# Patient Record
Sex: Male | Born: 1965 | Race: White | Hispanic: No | Marital: Married | Smoking: Never smoker
Health system: Southern US, Community
[De-identification: ages and names within clinical notes are randomized; demographics above are authoritative.]

## PROBLEM LIST (undated history)

## (undated) DIAGNOSIS — E785 Hyperlipidemia, unspecified: Secondary | ICD-10-CM

## (undated) DIAGNOSIS — E079 Disorder of thyroid, unspecified: Secondary | ICD-10-CM

## (undated) DIAGNOSIS — I10 Essential (primary) hypertension: Secondary | ICD-10-CM

## (undated) HISTORY — DX: Essential (primary) hypertension: I10

## (undated) HISTORY — DX: Hyperlipidemia, unspecified: E78.5

## (undated) HISTORY — PX: THYROIDECTOMY: SHX17

## (undated) HISTORY — DX: Disorder of thyroid, unspecified: E07.9

---

## 2002-02-27 ENCOUNTER — Encounter: Payer: Self-pay | Admitting: Family Medicine

## 2002-02-27 ENCOUNTER — Encounter: Admission: RE | Admit: 2002-02-27 | Discharge: 2002-02-27 | Payer: Self-pay | Admitting: Family Medicine

## 2011-02-15 ENCOUNTER — Other Ambulatory Visit: Payer: Self-pay | Admitting: Orthopedic Surgery

## 2011-02-15 DIAGNOSIS — M25561 Pain in right knee: Secondary | ICD-10-CM

## 2011-02-17 ENCOUNTER — Ambulatory Visit
Admission: RE | Admit: 2011-02-17 | Discharge: 2011-02-17 | Disposition: A | Discharge status: Home / Self Care | Payer: BLUE CROSS/BLUE SHIELD | Source: Ambulatory Visit | Attending: Orthopedic Surgery | Admitting: Orthopedic Surgery

## 2011-02-17 DIAGNOSIS — M25561 Pain in right knee: Secondary | ICD-10-CM

## 2011-02-17 MED ORDER — GADOBENATE DIMEGLUMINE 529 MG/ML IV SOLN
19.0000 mL | Freq: Once | INTRAVENOUS | Status: AC | PRN
  Administered 2011-02-17: 19 mL via INTRAVENOUS

## 2011-12-18 DIAGNOSIS — C499 Malignant neoplasm of connective and soft tissue, unspecified: Secondary | ICD-10-CM | POA: Insufficient documentation

## 2012-06-18 ENCOUNTER — Ambulatory Visit
Admission: RE | Admit: 2012-06-18 | Discharge: 2012-06-18 | Disposition: A | Discharge status: Home / Self Care | Payer: BLUE CROSS/BLUE SHIELD | Source: Ambulatory Visit | Attending: Orthopedic Surgery | Admitting: Orthopedic Surgery

## 2012-06-18 ENCOUNTER — Other Ambulatory Visit: Payer: Self-pay | Admitting: Orthopedic Surgery

## 2012-06-18 DIAGNOSIS — E041 Nontoxic single thyroid nodule: Secondary | ICD-10-CM

## 2012-06-19 ENCOUNTER — Other Ambulatory Visit: Payer: Self-pay | Admitting: Orthopedic Surgery

## 2012-06-19 DIAGNOSIS — E041 Nontoxic single thyroid nodule: Secondary | ICD-10-CM

## 2012-06-26 ENCOUNTER — Ambulatory Visit
Admission: RE | Admit: 2012-06-26 | Discharge: 2012-06-26 | Disposition: A | Discharge status: Home / Self Care | Payer: BC Managed Care – PPO | Source: Ambulatory Visit | Attending: Orthopedic Surgery | Admitting: Orthopedic Surgery

## 2012-06-26 ENCOUNTER — Other Ambulatory Visit (HOSPITAL_COMMUNITY)
Admission: RE | Admit: 2012-06-26 | Discharge: 2012-06-26 | Disposition: A | Discharge status: Home / Self Care | Payer: BC Managed Care – PPO | Source: Ambulatory Visit | Attending: Diagnostic Radiology | Admitting: Diagnostic Radiology

## 2012-06-26 DIAGNOSIS — E041 Nontoxic single thyroid nodule: Secondary | ICD-10-CM

## 2012-06-26 DIAGNOSIS — E049 Nontoxic goiter, unspecified: Secondary | ICD-10-CM | POA: Insufficient documentation

## 2012-07-10 NOTE — Progress Notes (Signed)
Quick Note:  DR Jana Half SHOULD RECEIVE THIS IN THE MAIL, BUT I ALSO FAXED IT TO [phone]. ______

## 2012-07-19 DIAGNOSIS — I10 Essential (primary) hypertension: Secondary | ICD-10-CM | POA: Insufficient documentation

## 2012-07-19 DIAGNOSIS — K219 Gastro-esophageal reflux disease without esophagitis: Secondary | ICD-10-CM | POA: Insufficient documentation

## 2012-08-29 DIAGNOSIS — C73 Malignant neoplasm of thyroid gland: Secondary | ICD-10-CM | POA: Insufficient documentation

## 2012-12-16 DIAGNOSIS — R911 Solitary pulmonary nodule: Secondary | ICD-10-CM | POA: Insufficient documentation

## 2013-08-13 IMAGING — US US THYROID BIOPSY
1 series · 5 of 5 positions shown · non-contrast
Comparison: 06/18/2012

CLINICAL DATA: Dominant thyroid nodule.

ULTRASOUND GUIDED NEEDLE ASPIRATE BIOPSY OF THE THYROID GLAND

[Series 1: us thyroid biopsy · 5 acquisitions, 5 frames shown]
[im 1/5]
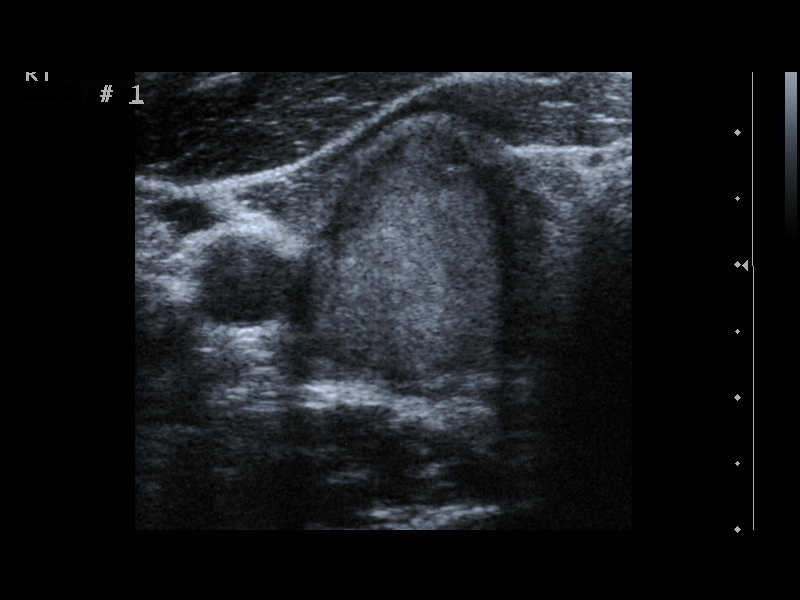
[im 2/5]
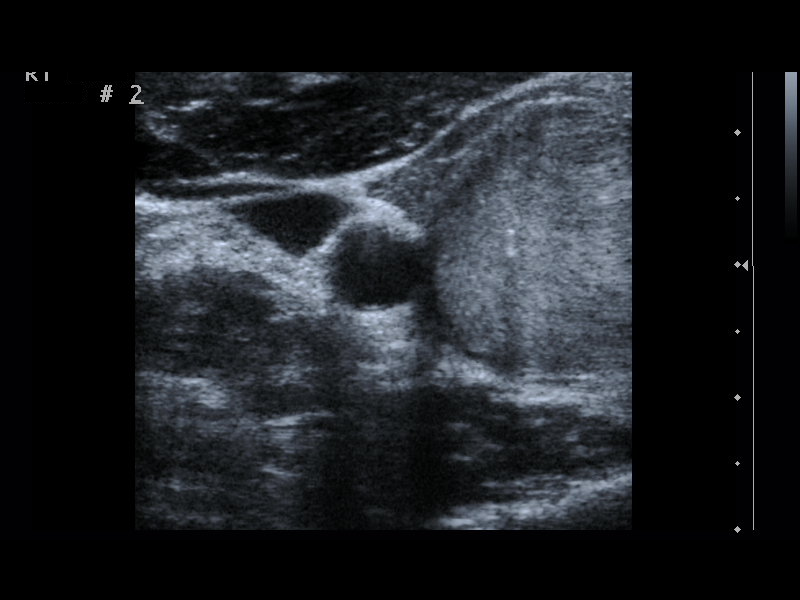
[im 3/5]
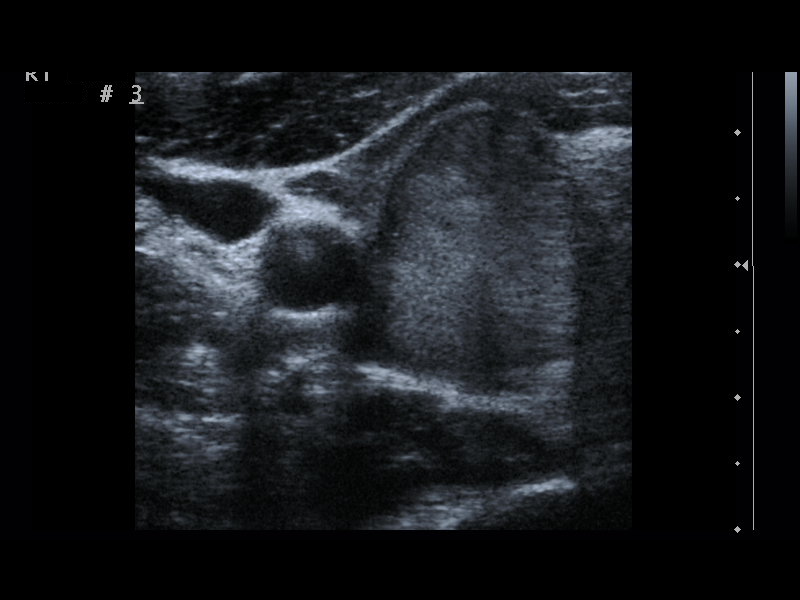
[im 4/5]
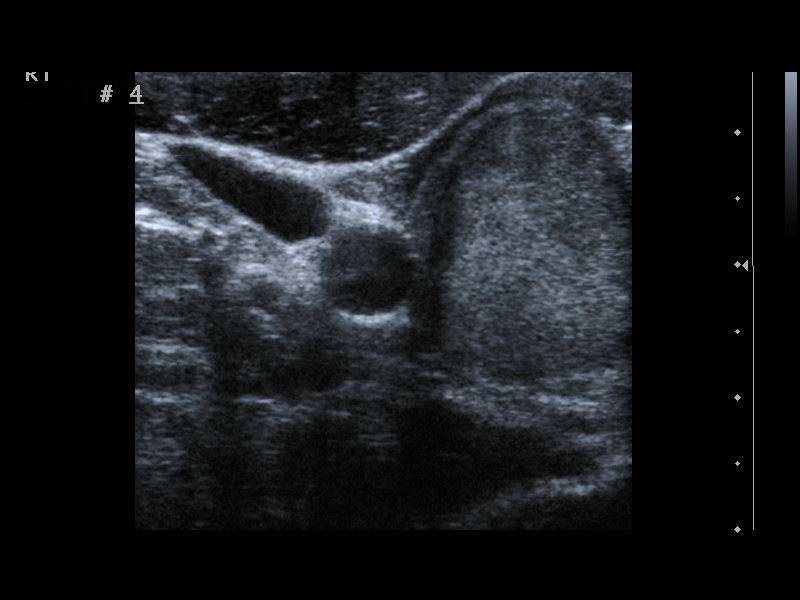
[im 5/5]
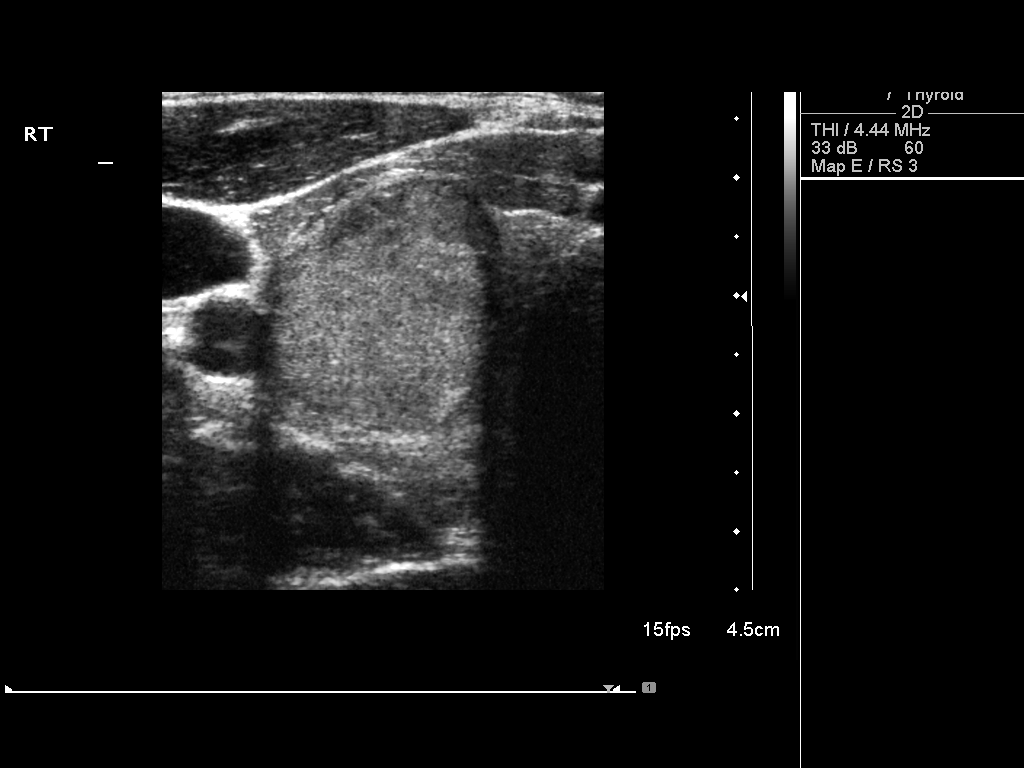

[5 of 5 positions shown; findings below may reference images not displayed]

Thyroid biopsy was thoroughly discussed with the patient and
questions were answered.  The benefits, risks, alternatives, and
complications were also discussed.  The patient understands and
wishes to proceed with the procedure.  Written consent was
obtained.

Ultrasound was performed to localize and mark an adequate site for
the biopsy.  The patient was then prepped and draped in a normal
sterile fashion.  Local anesthesia was provided with 1% lidocaine.
Using direct ultrasound guidance, 4 passes were made using 25 gauge
needles into the nodule within the right lobe of the thyroid.
Ultrasound was used to confirm needle placements on all occasions.
Specimens were sent to Pathology for analysis.

Complications:  None
FINDINGS: Dominant nodule in the right thyroid lobe.  Needle
position was confirmed within the lesion.
IMPRESSION: Ultrasound guided needle aspirate biopsy performed of the right
thyroid nodule.

## 2014-06-16 DIAGNOSIS — D490 Neoplasm of unspecified behavior of digestive system: Secondary | ICD-10-CM | POA: Insufficient documentation

## 2015-02-25 ENCOUNTER — Ambulatory Visit (INDEPENDENT_AMBULATORY_CARE_PROVIDER_SITE_OTHER): Payer: BLUE CROSS/BLUE SHIELD | Admitting: Podiatry

## 2015-02-25 ENCOUNTER — Ambulatory Visit (INDEPENDENT_AMBULATORY_CARE_PROVIDER_SITE_OTHER): Payer: BLUE CROSS/BLUE SHIELD

## 2015-02-25 ENCOUNTER — Encounter: Payer: Self-pay | Admitting: Podiatry

## 2015-02-25 VITALS — BP 128/84 | HR 77 | Resp 11 | Ht 70.0 in | Wt 210.0 lb

## 2015-02-25 DIAGNOSIS — M79671 Pain in right foot: Secondary | ICD-10-CM | POA: Diagnosis not present

## 2015-02-25 DIAGNOSIS — M722 Plantar fascial fibromatosis: Secondary | ICD-10-CM | POA: Diagnosis not present

## 2015-02-25 MED ORDER — METHYLPREDNISOLONE 4 MG PO TBPK: ORAL_TABLET | ORAL | Status: DC

## 2015-02-25 MED ORDER — MELOXICAM 15 MG PO TABS: 15.0000 mg | ORAL_TABLET | Freq: Every day | ORAL | Status: DC

## 2015-02-25 NOTE — Progress Notes (Signed)
   Subjective:    Patient ID: Drew Fisher, male    DOB: 29-May-1966, 49 y.o.   MRN: [id number]  HPI Comments: Pt states just about a year ago he was seen by an orthopedist and received a cortisone shot for the right heel pain, and had no relief.  Pt states he was also given stretches which worsened the pain, but athletic shoes and rest help.     Review of Systems  Endocrine:       Hx of thyroid cancer treated with surgery.  All other systems reviewed and are negative.      Objective:   Physical Exam: I have reviewed his past medical history medications allergies surgery social history and review of systems. Pulses are strongly palpable. Neurologic sensorium is intact per Semmes-Weinstein monofilament. Deep tendon reflexes are intact bilaterally muscle strength is 5 over 5 dorsiflexion plantar flexors and inverters and everters all intrinsic musculature is intact. Orthopedic evaluation of his resolved joints distal to the ankle in full range of motion without crepitation. Cutaneous evaluation demonstrates supple well-hydrated cutis no erythema edema saline drainage or odor. Radiograph does demonstrate soft tissue increased density at the plantar fascial calcaneal insertion site of the right heel with plantar distally oriented calcaneal heel spur. No fractures are identified. Pain on palpation medial calcaneal tubercle of the right heel consistent with plantar fasciitis.        Assessment & Plan:  Assessment: Plantar fasciitis right foot.  Plan: Discussed etiology pathology conservative or surgical therapies. Injected his right heel today with Kenalog and local anesthetic. Started him on a Medrol Dosepak to be followed by meloxicam. Placed in the plantar fascial strap and a plantar fascial night splint. Discussed appropriate shoe gear stretching exercises and ice therapy. If he is not improved next visit we will consider orthotics. We may consider surgical intervention. This has been  going on for over a year.

## 2015-02-25 NOTE — Patient Instructions (Signed)

## 2015-04-01 ENCOUNTER — Encounter: Payer: Self-pay | Admitting: Podiatry

## 2015-04-01 ENCOUNTER — Ambulatory Visit (INDEPENDENT_AMBULATORY_CARE_PROVIDER_SITE_OTHER): Payer: BLUE CROSS/BLUE SHIELD | Admitting: Podiatry

## 2015-04-01 VITALS — BP 140/99 | HR 81 | Resp 16

## 2015-04-01 DIAGNOSIS — M722 Plantar fascial fibromatosis: Secondary | ICD-10-CM

## 2015-04-01 NOTE — Progress Notes (Signed)
Presents today for follow-up of plantar fasciitis and to be scheduled for orthotics.  Objective: Vital signs are stable alert and oriented 3. No pain on palpation medial calcaneal tubercle.  Assessment: Well-healed plantar fasciitis.  Plan: Continue all conservative therapies. Orthotic scan today. Follow-up with me once estimated. Continue all other conservative therapies.

## 2015-04-13 ENCOUNTER — Other Ambulatory Visit: Payer: Self-pay | Admitting: Family Medicine

## 2015-04-13 DIAGNOSIS — K769 Liver disease, unspecified: Secondary | ICD-10-CM

## 2015-04-15 ENCOUNTER — Ambulatory Visit
Admission: RE | Admit: 2015-04-15 | Discharge: 2015-04-15 | Disposition: A | Discharge status: Home / Self Care | Payer: BLUE CROSS/BLUE SHIELD | Source: Ambulatory Visit | Attending: Family Medicine | Admitting: Family Medicine

## 2015-04-15 DIAGNOSIS — K769 Liver disease, unspecified: Secondary | ICD-10-CM

## 2015-04-15 MED ORDER — GADOBENATE DIMEGLUMINE 529 MG/ML IV SOLN
20.0000 mL | Freq: Once | INTRAVENOUS | Status: AC | PRN
  Administered 2015-04-15: 20 mL via INTRAVENOUS

## 2015-04-26 ENCOUNTER — Inpatient Hospital Stay
Admission: RE | Admit: 2015-04-26 | Discharge: 2015-04-26 | Disposition: A | Discharge status: Home / Self Care | Payer: Self-pay | Source: Ambulatory Visit | Attending: Family Medicine | Admitting: Family Medicine

## 2015-04-26 ENCOUNTER — Other Ambulatory Visit: Payer: Self-pay | Admitting: Family Medicine

## 2015-04-26 DIAGNOSIS — R52 Pain, unspecified: Secondary | ICD-10-CM

## 2015-07-01 ENCOUNTER — Encounter: Payer: Self-pay | Admitting: Podiatry

## 2015-07-01 ENCOUNTER — Ambulatory Visit (INDEPENDENT_AMBULATORY_CARE_PROVIDER_SITE_OTHER): Payer: BLUE CROSS/BLUE SHIELD | Admitting: Podiatry

## 2015-07-01 ENCOUNTER — Ambulatory Visit (INDEPENDENT_AMBULATORY_CARE_PROVIDER_SITE_OTHER): Payer: BLUE CROSS/BLUE SHIELD

## 2015-07-01 VITALS — BP 142/84 | HR 69 | Resp 16

## 2015-07-01 DIAGNOSIS — M722 Plantar fascial fibromatosis: Secondary | ICD-10-CM

## 2015-07-01 MED ORDER — MELOXICAM 15 MG PO TABS: 15.0000 mg | ORAL_TABLET | Freq: Every day | ORAL | Status: DC

## 2015-07-02 NOTE — Progress Notes (Signed)
He presents today for follow-up of his plantar fasciitis. He states that the left is doing great now the right one is starting to hurt. He states that he also needs more of his meloxicam. He would like to go ahead and see about getting the orthotics at all possible.  Objective: vital signs are stable alert and oriented 3. Pulses are strongly palpable bilateral. Neurologic sensorium is intact per Semmes-Weinstein monofilament. Deep tendon reflexes are intact bilateral and muscle strength is 5 over 5 dorsiflexors plantar flexors and inverters everters all intrinsic musculature is intact. Orthopedic evaluation demonstrates all joints distal to the ankle for range of motion without crepitus no pain on palpation medial calcaneal tubercle of the left heel however he does have pain on palpation medial calcaneal tubercle of the right heel. Radiographs taken today do demonstrate 3 views of the right foot with soft tissue increase in density at the plantar fascial calcaneal insertion site. This is indicative of plantar fasciitis. Cutaneous evaluation is relatively normal.   Assessment: resolving plantar fasciitis left plantar fasciitis right acute.  Plan: discussed etiology pathology conservative versus surgical therapies. Injected the right heel today with Kenalog and local and aesthetic. He was scanned for set of orthotics. Rotatory prescription for meloxicam 15 mg 1 by mouth daily. Follow up with him once his orthotics come in.

## 2015-07-27 ENCOUNTER — Ambulatory Visit: Payer: BLUE CROSS/BLUE SHIELD | Admitting: *Deleted

## 2015-07-27 DIAGNOSIS — M722 Plantar fascial fibromatosis: Secondary | ICD-10-CM

## 2015-07-27 NOTE — Progress Notes (Signed)
Patient ID: Drew Fisher, male   DOB: 17-Jul-1966, 49 y.o.   MRN: [id number] Patient presents for orthotic pick up.  Verbal and written break in and wear instructions given.  Patient will follow up in 4 weeks if symptoms worsen or fail to improve.

## 2015-07-27 NOTE — Patient Instructions (Signed)

## 2015-11-04 ENCOUNTER — Ambulatory Visit: Payer: BLUE CROSS/BLUE SHIELD | Admitting: Podiatry

## 2015-11-05 ENCOUNTER — Ambulatory Visit (INDEPENDENT_AMBULATORY_CARE_PROVIDER_SITE_OTHER): Payer: BLUE CROSS/BLUE SHIELD | Admitting: Podiatry

## 2015-11-05 ENCOUNTER — Ambulatory Visit (INDEPENDENT_AMBULATORY_CARE_PROVIDER_SITE_OTHER): Payer: BLUE CROSS/BLUE SHIELD

## 2015-11-05 ENCOUNTER — Encounter: Payer: Self-pay | Admitting: Podiatry

## 2015-11-05 VITALS — BP 124/82 | HR 86 | Resp 18

## 2015-11-05 DIAGNOSIS — R52 Pain, unspecified: Secondary | ICD-10-CM | POA: Diagnosis not present

## 2015-11-05 DIAGNOSIS — R200 Anesthesia of skin: Secondary | ICD-10-CM

## 2015-11-05 DIAGNOSIS — R208 Other disturbances of skin sensation: Secondary | ICD-10-CM

## 2015-11-05 NOTE — Patient Instructions (Signed)

## 2015-11-08 ENCOUNTER — Encounter: Payer: Self-pay | Admitting: Podiatry

## 2015-11-09 ENCOUNTER — Encounter: Payer: Self-pay | Admitting: Podiatry

## 2015-11-09 NOTE — Progress Notes (Signed)
Patient ID: Drew Fisher, male   DOB: [birth year], 50 y.o.   MRN: [id number]  Subjective: 50 year old male presents the office with concerns of left fourth and fifth toe numbness which is been ongoing for last several weeks. He states this started after he worked old shoes while doing housework. He states the heel pain is much improved. He denies any recent injury or trauma. Denies any swelling or redness. No other complaints at this time in no acute changes.  Objective: AAO 3, NAD DP/PT pulses 2/4, CRT less than 3 seconds Protective sensation intact with Simms Weinstein monofilament. There is decreased sensation of the left fourth and fifth toes however overall sensation is intact with Derrel Nip monofilament There is no tenderness to palpation and no pinpoint bony tenderness to bilateral lower extremity and there is no pain along the digits or metatarsals. There is no pain with MPJ range of motion or restrictions. No open lesions or pre-ulcerative lesions. There is no pain with calf compression, swelling, warmth, erythema.  Assessment: 50 year old male left fourth and fifth toe numbness  Plan: -Treatment options discussed including all alternatives, risks, and complications -X-rays were obtained and reviewed with the patient. Compared to prior x-rays there does appear to be some change the distal phalanx of the left fourth toe. No history of injury or trauma.  -Etiology of symptoms were discussed -Discussed shoe gear modifications to help take pressure off the fourth and fifth toes. -Anti-inflammatories as needed. -If symptoms not resolve in the next 3-4 weeks to call the office or sooner if there is any worsening. Call any questions or concerns.  Celesta Gentile, DPM

## 2015-12-02 ENCOUNTER — Ambulatory Visit: Payer: BLUE CROSS/BLUE SHIELD | Admitting: Podiatry

## 2016-06-01 IMAGING — MR MR ABDOMEN WO/W CM
9 of 17 series · 24 of 48 positions shown · IV contrast (multihance)
Comparison: [REDACTED] MRI of 07/01/2014

ADDENDUM:
Although mentioned in the body of report, the abnormal enhancement
identified in the lower right chest wall was not mentioned in the
IMPRESSION section of the report. Given that the finding may be
related to a rib or pleural lesion, further evaluation is
recommended. CT scan of the chest with contrast may prove helpful to
further evaluate.

These results will be called to the ordering clinician or
representative by the Radiologist Assistant, and communication
documented in the PACS or zVision Dashboard.
CLINICAL DATA: Subsequent encounter for lobulated enhancing liver
lesions in a patient with history of thyroid cancer and right knee
sarcoma.
EXAM:
MRI ABDOMEN WITHOUT AND WITH CONTRAST
TECHNIQUE: Multiplanar multisequence MR imaging of the abdomen was performed
both before and after the administration of intravenous contrast.
CONTRAST:  20mL MULTIHANCE GADOBENATE DIMEGLUMINE 529 MG/ML IV SOLN

[Series 2: T2 · axial · 5.0mm · 0.59mm/px · z∈[-131,+103]mm · 2 of 40 slices shown]
[im 1/40]
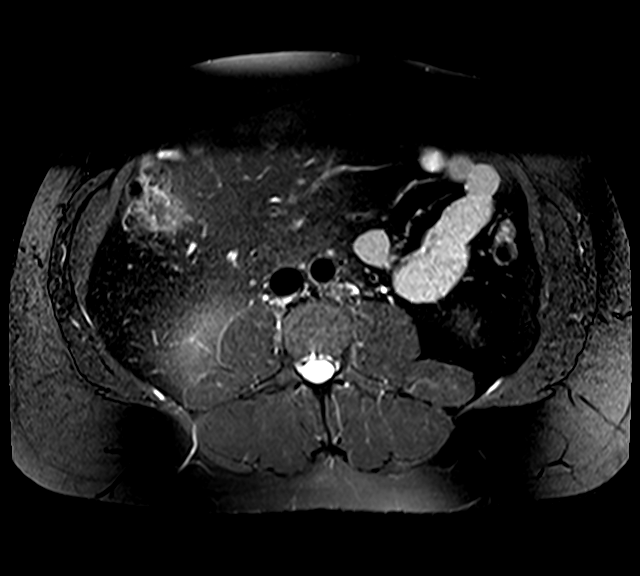
[im 40/40]
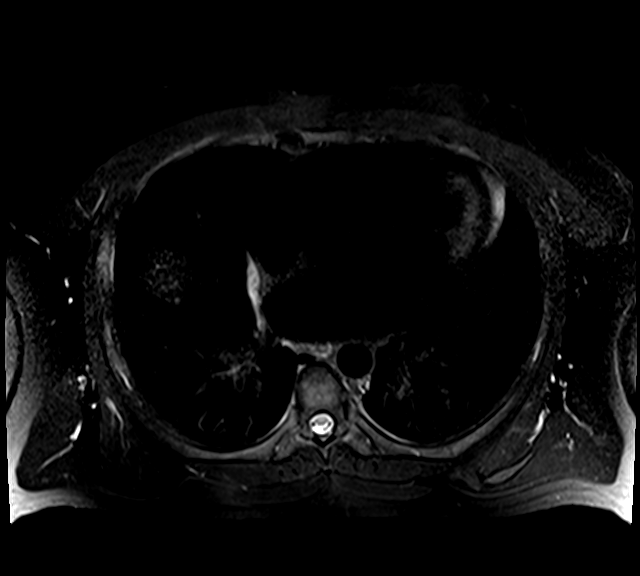

[Series 3: [id number] · axial · 5.0mm · 1.98mm/px · z∈[-131,+103]mm · 5 of 120 slices shown]
[im 1/120]
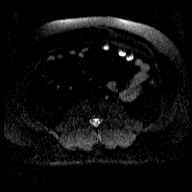
[im 30/120]
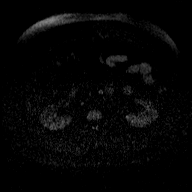
[im 60/120]
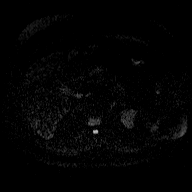
[im 90/120]
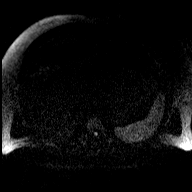
[im 120/120]
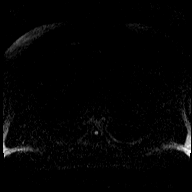

[Series 4: [id number] · axial · 5.0mm · 1.98mm/px · z∈[-131,+103]mm · 2 of 40 slices shown]
[im 1/40]
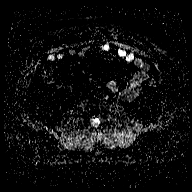
[im 40/40]
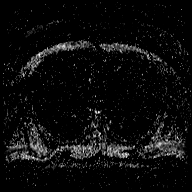

[Series 6: cor haste · coronal · 5.0mm · 0.74mm/px · 2 of 37 slices shown]
[im 1/37]
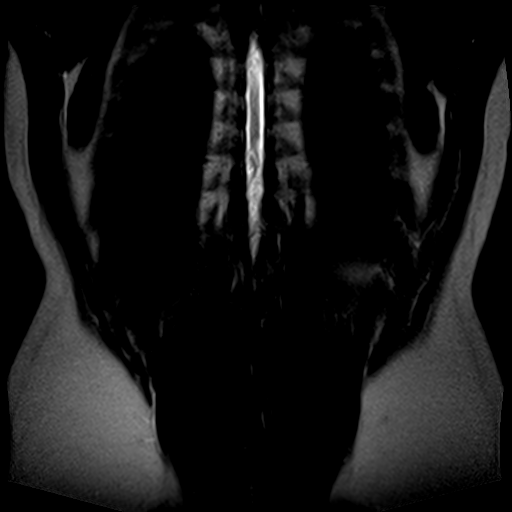
[im 37/37]
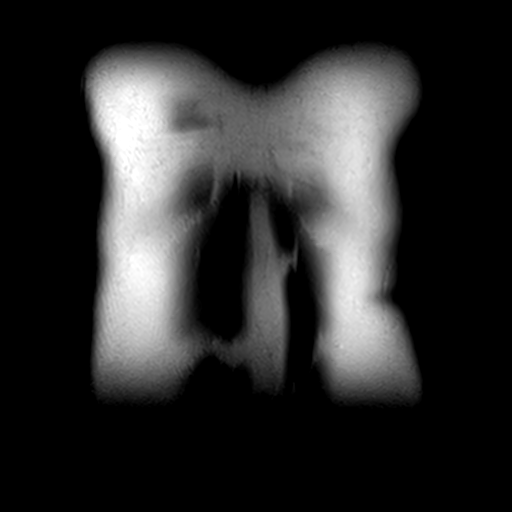

[Series 7: T1 · axial · 5.0mm · 0.74mm/px · z∈[-158,+77]mm · 4 of 96 slices shown]
[im 1/96]
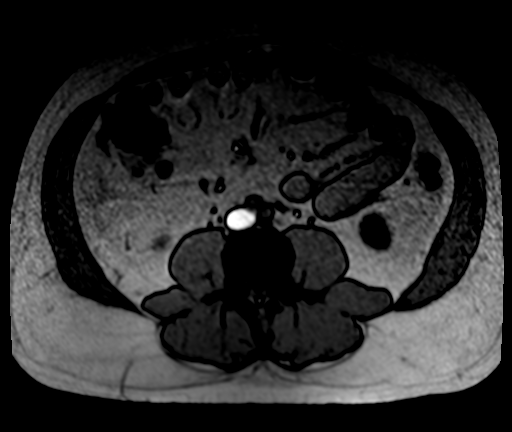
[im 32/96]
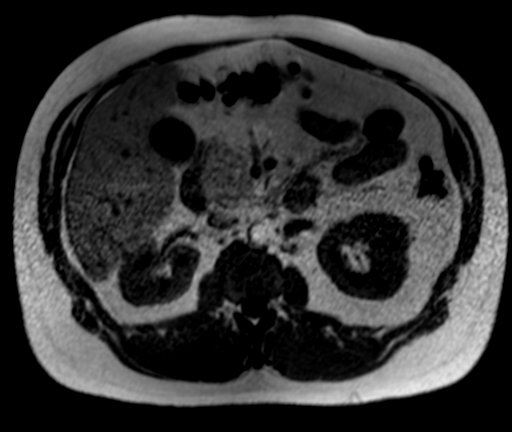
[im 64/96]
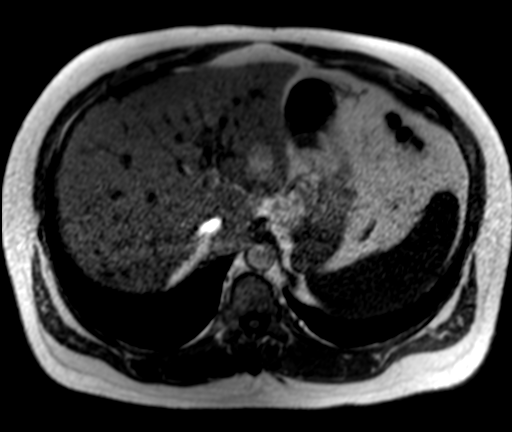
[im 96/96]
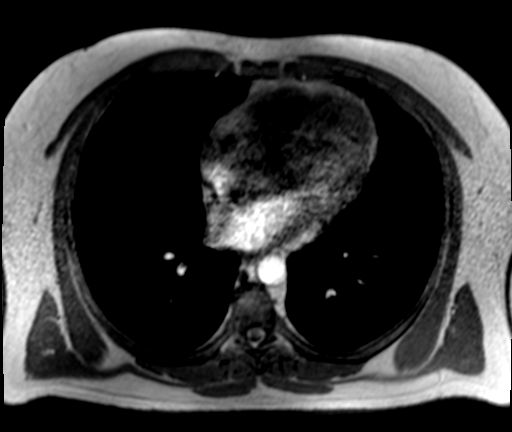

[Series 8: axial haste · axial · 5.0mm · 0.74mm/px · 1 of 40 slices shown]
[im 1/40]
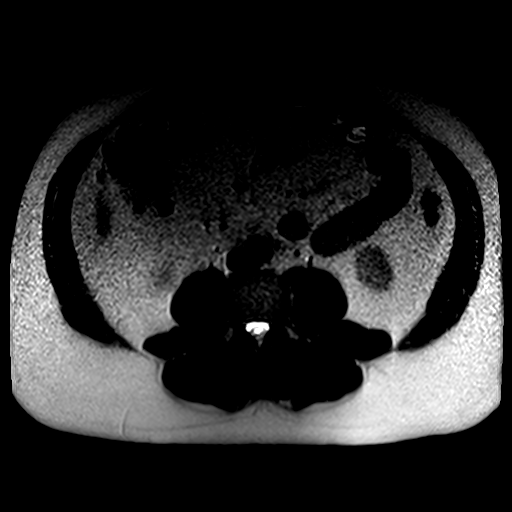

[Series 9: bSSFP · axial · 5.0mm · 0.74mm/px · z∈[-155,+75]mm · 2 of 47 slices shown]
[im 1/47]
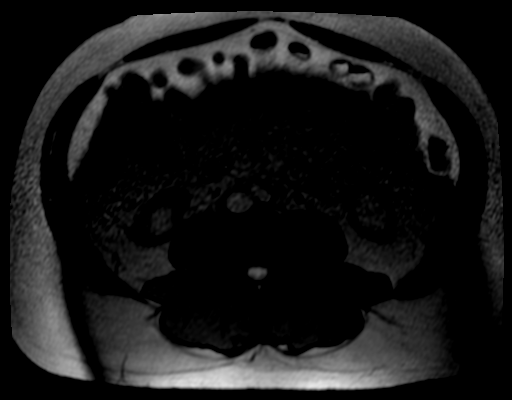
[im 47/47]
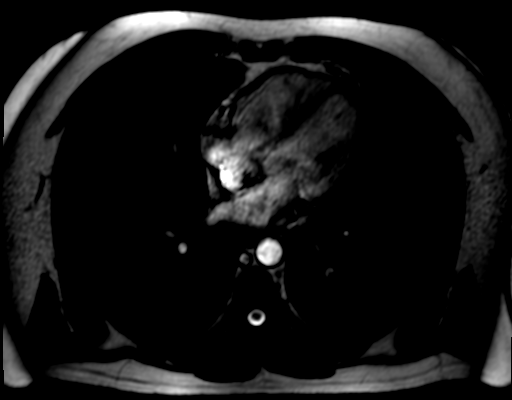

[Series 10: T1 dynamic · axial · non-contrast · 2.3mm · 0.78mm/px · z∈[-158,+78]mm · 3 of 104 slices shown]
[im 1/104]
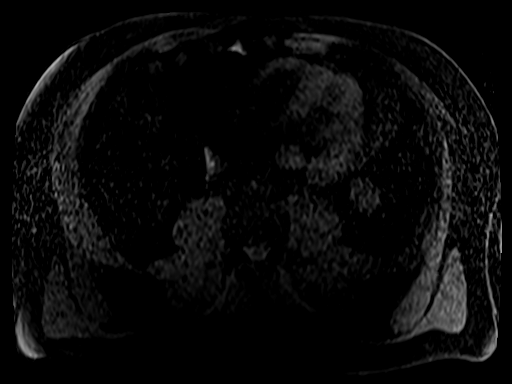
[im 52/104]
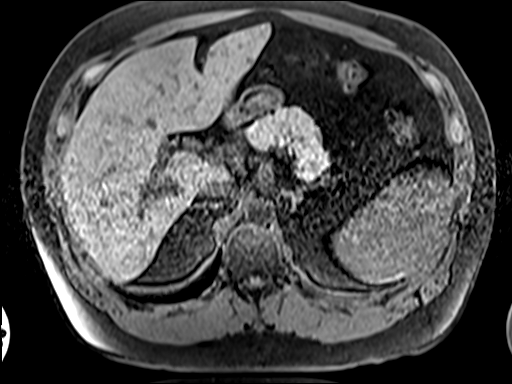
[im 104/104]
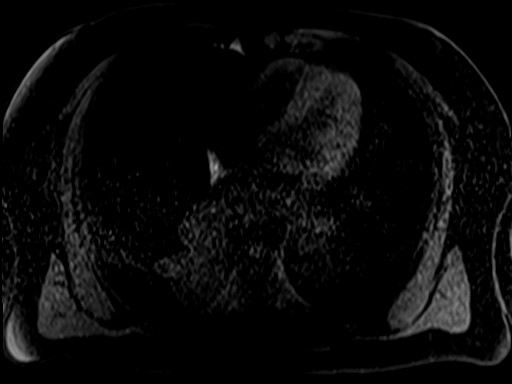

[Series 11: T1 dynamic post-contrast · axial · 2.3mm · 0.78mm/px · z∈[-158,+78]mm · 3 of 104 slices shown]
[im 1/104]
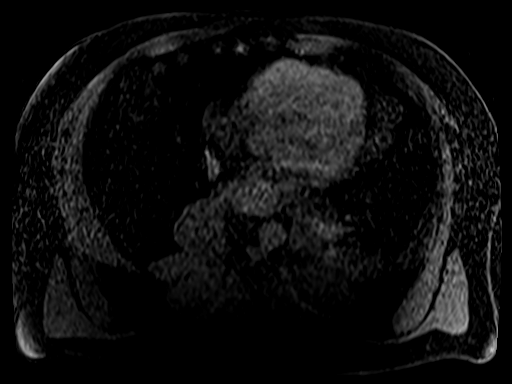
[im 52/104]
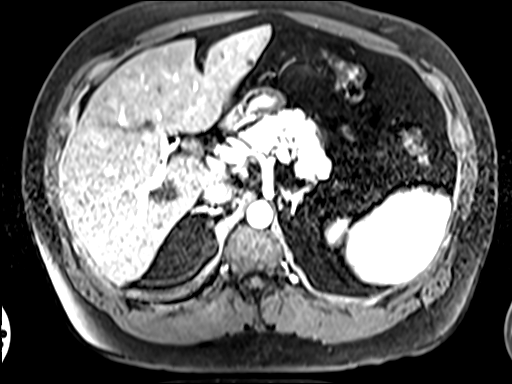
[im 104/104]
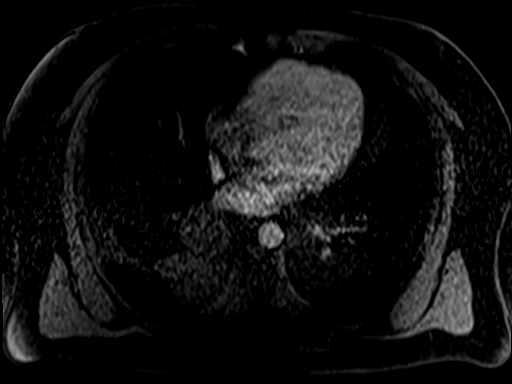

[24 of 48 positions shown; findings below may reference images not displayed]

FINDINGS: Lower chest: There is an area of abnormal enhancement identified in
the right chest wall (see image 6 of series 17). This is seen on
coronal imaging only and has not been included in the field of view
on axial imaging. Pleural or rib based lesion is a concern.

Hepatobiliary: 20 cm in cranial caudal length, enlarged. As noted on
the previous exam, there is a lobular T1 hypo intense, moderately T2
hyperintense lesion in the posterior medial right liver, adjacent to
the IVC. After IV contrast administration, this shows a nodular
peripheral pattern of enhancement with central filling in of
enhancement on later postcontrast images. The lesion measures 3.3 x
3.6 cm today compared to 3.5 x 3.6 cm when I measure in the same
axes at the same level on the previous study.

The second dominant lesion in the liver is in the lateral segment of
the left liver and measures 2.0 x 2.0 cm on T2 weighted imaging
today which is unchanged when I remeasure on the T2 sequence from
the previous study. This lesion has low signal intensity on T1
weighted imaging and moderately increased T2 signal. After IV
contrast administration, lesion demonstrates irregular peripheral
enhancement with filling in on later phase imaging. There is
anomalous perfusion around the lesion on arterial phase imaging
which may be related to shunting.

11 mm hypervascular lesion is seen in the central liver, near the
junction of segments IV and V (see image 49 series 11).

As described on the previous study, the patient has multiple
scattered probable cysts within the liver parenchyma. These are
superimposed on a background of innumerable tiny T2 hyperintensities
suggesting biliary hamartomas.

No evidence for gallstones. No intrahepatic or extrahepatic biliary
dilation.

Pancreas: No focal mass lesion. No dilatation of the main duct. No
intraparenchymal cyst. No peripancreatic edema.

Spleen: No splenomegaly. No focal mass lesion.

Adrenals/Urinary Tract: No adrenal nodule or mass. Kidneys are
unremarkable.

Stomach/Bowel: Stomach is nondistended. No gastric wall thickening.
No evidence of outlet obstruction. Duodenum is normally positioned
as is the ligament of Treitz. Diverticular changes are seen in the
visualized abdominal segments of the colon without features of
diverticulitis.

Vascular/Lymphatic: No abdominal aortic aneurysm. No evidence for
lymphadenopathy in the abdomen.

Other: No intraperitoneal free fluid.

Musculoskeletal: No abnormal marrow signal within the visualized
bony anatomy.
IMPRESSION: 1. The 2 dominant enhancing lesions in the liver show no interval
change in the 10 month interval since the prior study. While these
lesions do not have imaging features characteristic of typical
cavernous hemangioma, atypical hemangioma remains a consideration
for both lesions. The interval stability is reassuring for a benign
process. Prior report stated that the smaller of the 2 lesions had
been stable since 9549 but did not reference a comparison for the
larger lesion. Followup MRI in 8-12 months could be used to ensure
that these remain stable.
2. 11 mm hypervascular lesion in the central liver cannot be further
characterized but is also stable since the prior study. This could
be reassessed at the time of followup imaging.
3. Scattered hepatic cysts on a background of probable diffuse
biliary hamartomas.

## 2016-07-12 DIAGNOSIS — Z85850 Personal history of malignant neoplasm of thyroid: Secondary | ICD-10-CM | POA: Insufficient documentation

## 2016-07-12 DIAGNOSIS — E890 Postprocedural hypothyroidism: Secondary | ICD-10-CM | POA: Insufficient documentation

## 2016-07-12 DIAGNOSIS — K769 Liver disease, unspecified: Secondary | ICD-10-CM | POA: Insufficient documentation

## 2016-07-12 DIAGNOSIS — E669 Obesity, unspecified: Secondary | ICD-10-CM | POA: Insufficient documentation

## 2016-07-12 DIAGNOSIS — J309 Allergic rhinitis, unspecified: Secondary | ICD-10-CM | POA: Insufficient documentation

## 2016-07-12 DIAGNOSIS — Z85831 Personal history of malignant neoplasm of soft tissue: Secondary | ICD-10-CM | POA: Insufficient documentation

## 2016-07-12 DIAGNOSIS — E782 Mixed hyperlipidemia: Secondary | ICD-10-CM | POA: Insufficient documentation

## 2017-09-18 ENCOUNTER — Ambulatory Visit: Payer: BLUE CROSS/BLUE SHIELD | Admitting: Podiatry

## 2017-09-18 ENCOUNTER — Ambulatory Visit (INDEPENDENT_AMBULATORY_CARE_PROVIDER_SITE_OTHER): Payer: 59

## 2017-09-18 DIAGNOSIS — M7752 Other enthesopathy of left foot: Secondary | ICD-10-CM | POA: Diagnosis not present

## 2017-09-18 DIAGNOSIS — M722 Plantar fascial fibromatosis: Secondary | ICD-10-CM

## 2017-09-18 DIAGNOSIS — M79672 Pain in left foot: Secondary | ICD-10-CM

## 2017-09-18 MED ORDER — MELOXICAM 15 MG PO TABS: 15.0000 mg | ORAL_TABLET | Freq: Every day | ORAL | 3 refills | Status: AC

## 2017-09-18 MED ORDER — METHYLPREDNISOLONE 4 MG PO TBPK: ORAL_TABLET | ORAL | 0 refills | Status: DC

## 2017-09-18 NOTE — Progress Notes (Signed)
He presents today for a chief concern of pain to the lateral edge of his left foot. States this been this way now for several months and is leaving for water follow-up in the next few days on admission trip. He's tried to do a lot of walking in physical activities nothing seems to make this any better he's tried resting and anti-inflammatories. Has a history of plantar fasciitis. Denies any trauma to the foot.  Objective: Vital signs are stable alert and oriented 3. Pulses are palpable. He has pain on palpation medial tubercle of the left heel and pain on palpation of the fifth metatarsal base of the left foot. There appears to be reverse bursa or possible bursitis on the fifth metatarsal base left.  Radiographs demonstrate soft tissue increase in density of plantar fascia kidney insertion site of the left heel.  Assessment plantar fasciitis with lateral compensatory syndrome was resulting in bursitis fifth met left.  Plan: Injected both areas today plantar fasciitis with Kenalog and dexamethasone to the fifth met base left. Started him on a Medrol Dosepak to be followed by meloxicam. He will continue to use the plantar fascia brace and nicely that he has home.

## 2017-09-18 NOTE — Patient Instructions (Signed)

## 2018-04-16 ENCOUNTER — Ambulatory Visit (INDEPENDENT_AMBULATORY_CARE_PROVIDER_SITE_OTHER): Payer: 59

## 2018-04-16 ENCOUNTER — Other Ambulatory Visit: Payer: Self-pay

## 2018-04-16 ENCOUNTER — Encounter: Payer: Self-pay | Admitting: Podiatry

## 2018-04-16 ENCOUNTER — Ambulatory Visit (INDEPENDENT_AMBULATORY_CARE_PROVIDER_SITE_OTHER): Payer: 59 | Admitting: Podiatry

## 2018-04-16 ENCOUNTER — Other Ambulatory Visit: Payer: Self-pay | Admitting: Podiatry

## 2018-04-16 DIAGNOSIS — S93402A Sprain of unspecified ligament of left ankle, initial encounter: Secondary | ICD-10-CM

## 2018-04-16 DIAGNOSIS — M779 Enthesopathy, unspecified: Secondary | ICD-10-CM

## 2018-04-16 NOTE — Progress Notes (Signed)
He presents today chief complaint of pain to the lateral ankle left he said he twisted his ankle about a week and a half ago he states that still swollen hurts a little more last few days up into the leg states that he mowed the yard just the other day and that he is been doing a lot on it he said Sunday he had swelling that was hanging over his shoes.  He is been able to walk on it the entire time.  Objective: Vital signs are stable he is alert and oriented x3.  Pulses are palpable.  All of his tendons and ligaments appear to be functional he has no anterior displacement of the foot neurologic sensorium is intact radiographs do not demonstrate any type of osseous abnormalities other than soft tissue swelling no acute findings.  Assessment probable grade 2 ankle sprain left.  Plan: This may compression anklet and a Tri-Lock brace.  Follow-up with him in 1 month

## 2018-05-23 ENCOUNTER — Ambulatory Visit: Payer: 59 | Admitting: Podiatry

## 2019-09-01 ENCOUNTER — Other Ambulatory Visit: Payer: Self-pay | Admitting: *Deleted

## 2019-09-01 DIAGNOSIS — Z20822 Contact with and (suspected) exposure to covid-19: Secondary | ICD-10-CM

## 2019-09-02 LAB — NOVEL CORONAVIRUS, NAA: SARS-CoV-2, NAA: NOT DETECTED

## 2020-08-13 ENCOUNTER — Other Ambulatory Visit: Payer: Self-pay | Admitting: Family Medicine

## 2020-08-13 DIAGNOSIS — D1803 Hemangioma of intra-abdominal structures: Secondary | ICD-10-CM

## 2020-08-24 ENCOUNTER — Ambulatory Visit
Admission: RE | Admit: 2020-08-24 | Discharge: 2020-08-24 | Disposition: A | Discharge status: Home / Self Care | Payer: BLUE CROSS/BLUE SHIELD | Source: Ambulatory Visit | Attending: Family Medicine | Admitting: Family Medicine

## 2020-08-24 DIAGNOSIS — D1803 Hemangioma of intra-abdominal structures: Secondary | ICD-10-CM

## 2021-10-11 IMAGING — US US ABDOMEN LIMITED
1 series · 13 of 25 positions shown · non-contrast
Comparison: None.
COMPARISON: None.

Addendum:
CLINICAL DATA: Follow-up liver hemangioma.

EXAM:
ULTRASOUND ABDOMEN LIMITED RIGHT UPPER QUADRANT

[Series 1: us abdomen limited · 0.21mm/px · 13 of 47 slices shown]
[im 1/47]
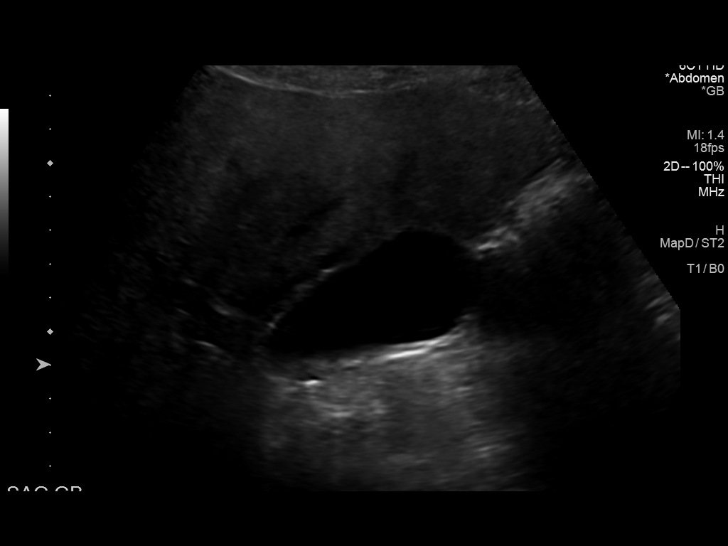
[im 4/47]
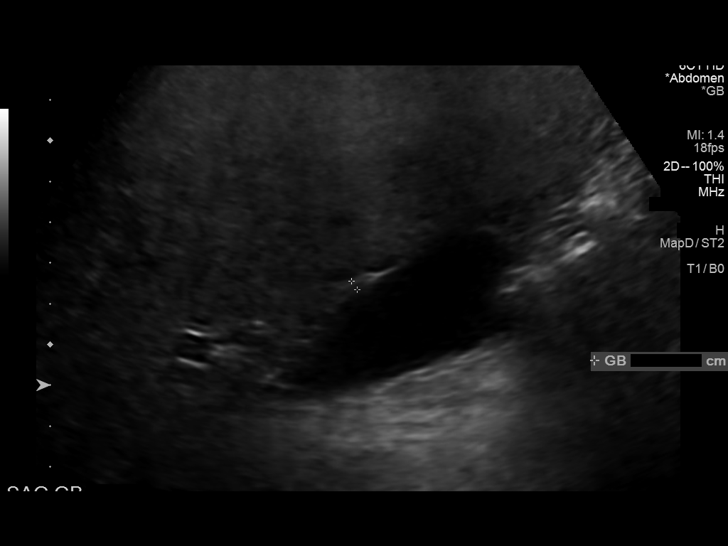
[im 8/47]
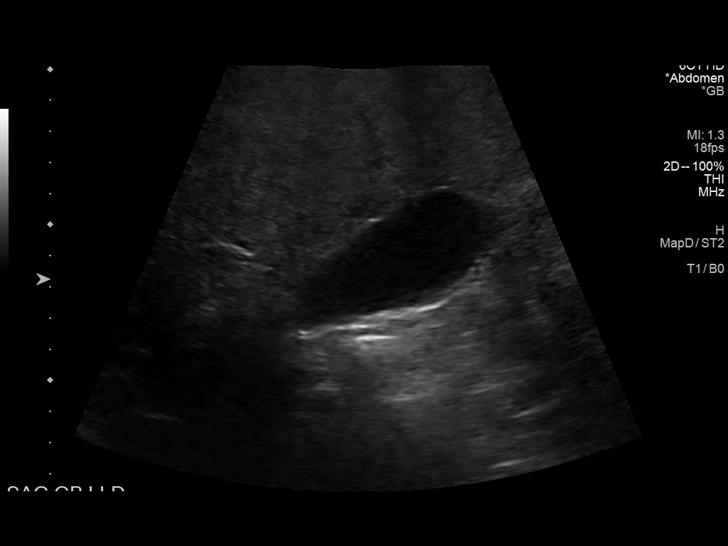
[im 12/47]
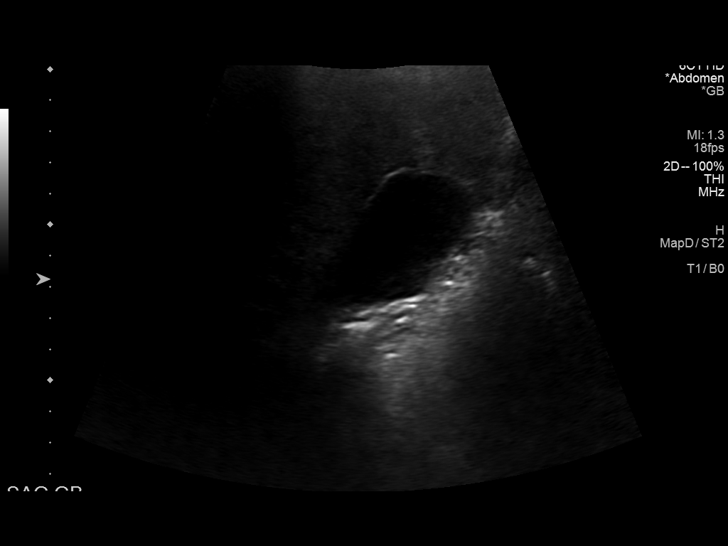
[im 16/47]
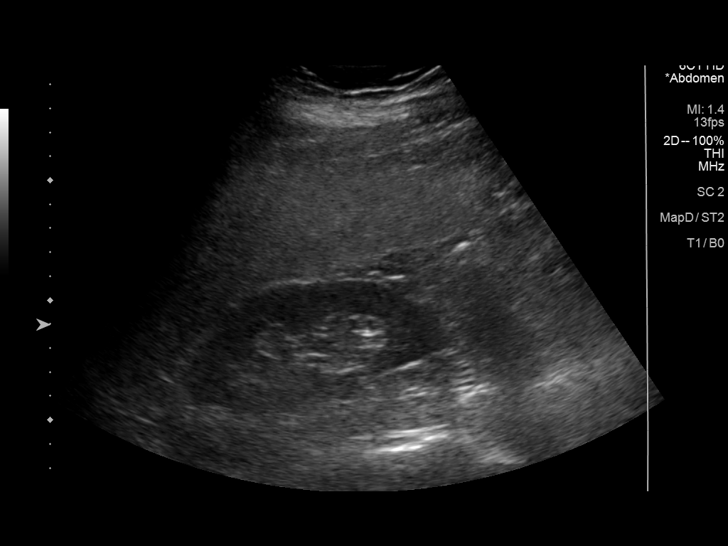
[im 20/47]
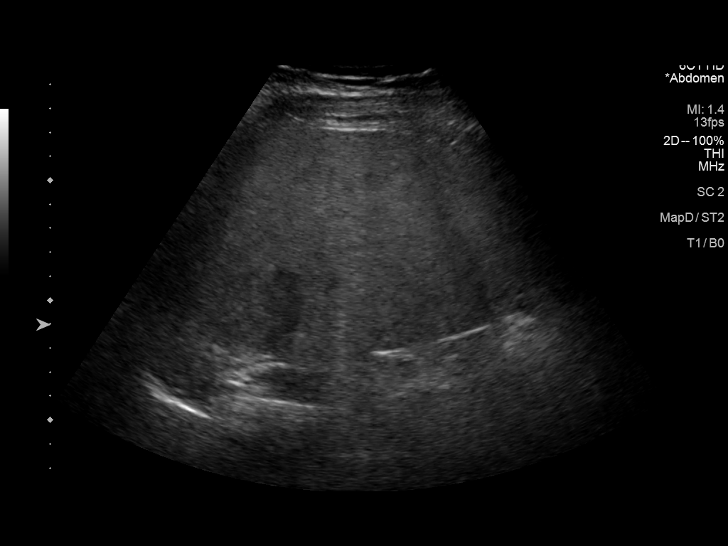
[im 24/47]
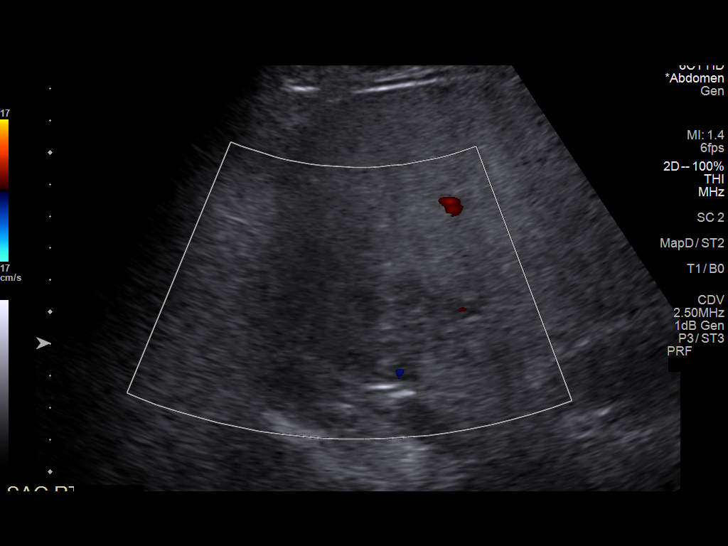
[im 27/47]
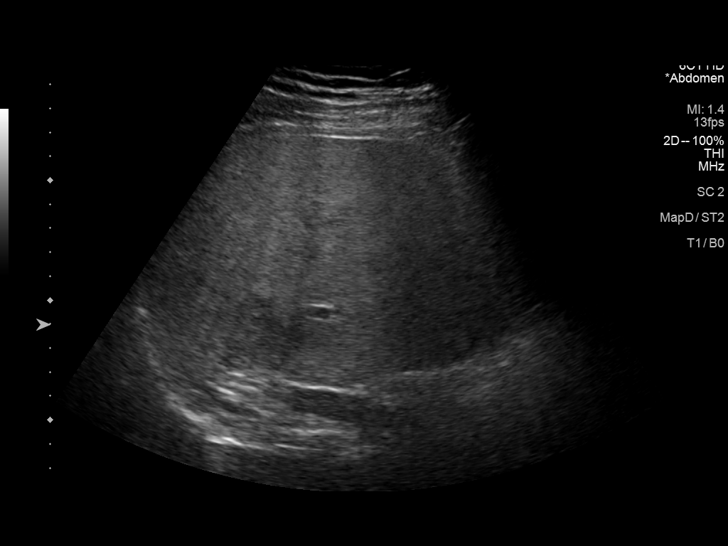
[im 31/47]
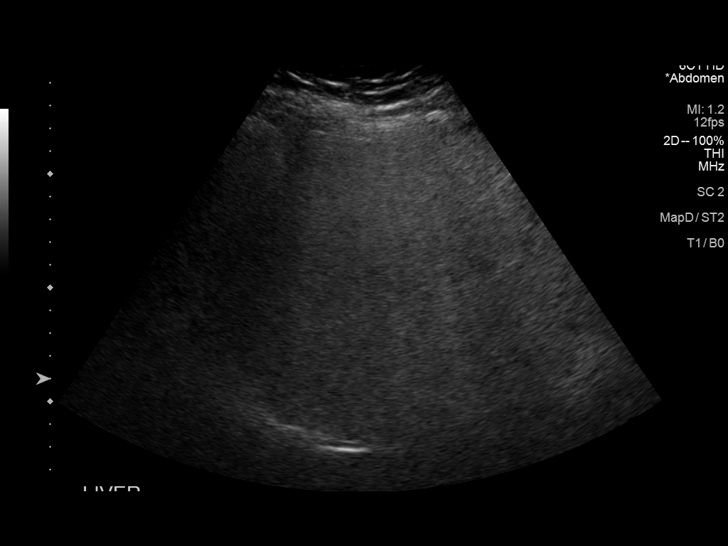
[im 35/47]
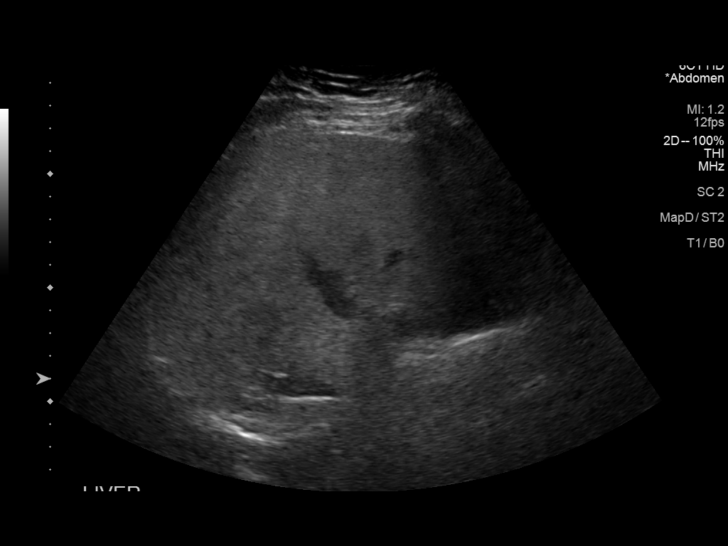
[im 39/47]
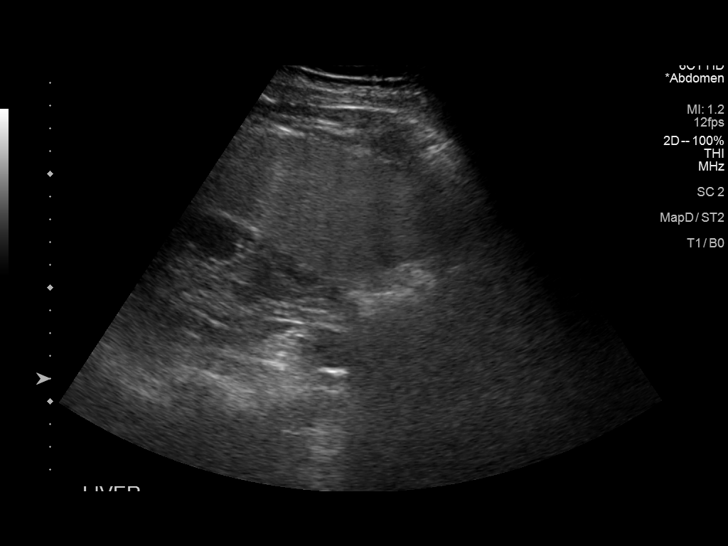
[im 43/47]
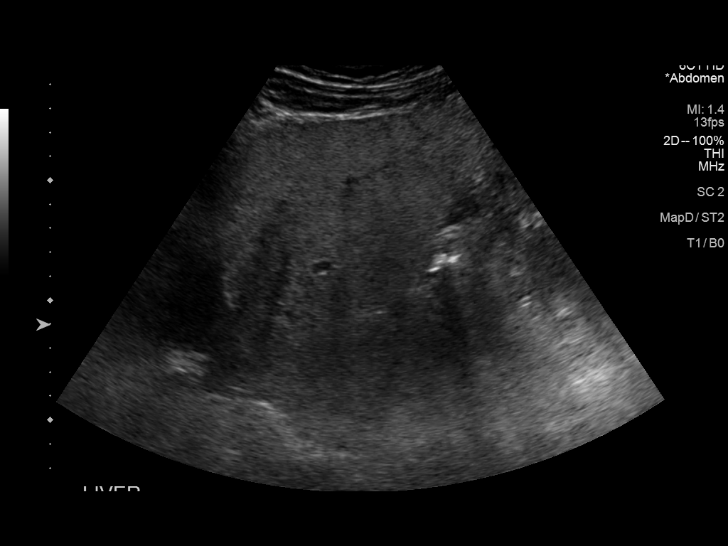
[im 47/47]
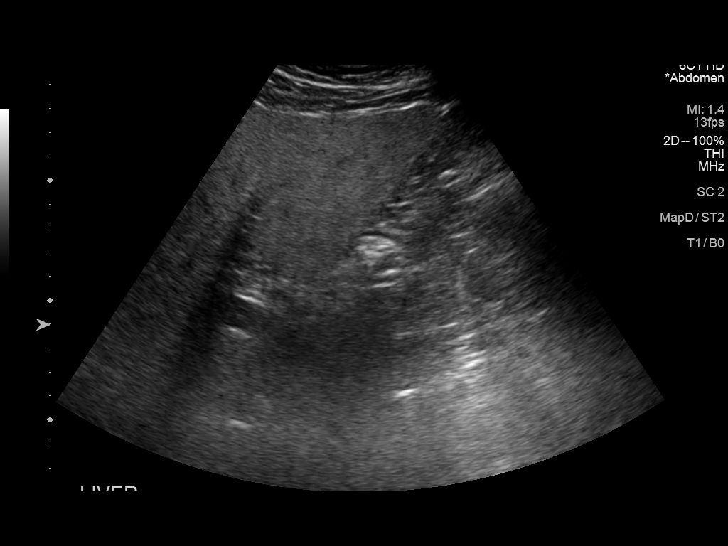

[13 of 25 positions shown; findings below may reference images not displayed]

FINDINGS: Gallbladder:

No gallstones or wall thickening visualized (2.5 mm). No sonographic
Murphy sign noted by sonographer.

Common bile duct:

Diameter: 3.9 mm

Liver:

A 3.8 cm x 3.3 cm x 4.8 cm area of heterogeneous hypoechogenicity is
seen within the right lobe of the liver. A mild amount of flow is
seen within this region on color Doppler evaluation. Heterogeneously
increased echogenicity of the liver parenchyma is seen. Portal vein
is patent on color Doppler imaging with normal direction of blood
flow towards the liver.

Other: None.
IMPRESSION: 1. Hypoechoic lesion within the right lobe of the liver which may
represent an atypical hemangioma. MRI correlation is recommended.

ADDENDUM:
MRI abdomen, dated April 15, 2015 was submitted for comparison.

The area of heterogeneous hypoechogenicity seen within the right
lobe of the liver on the current ultrasound exam likely corresponds
to the lobular T1 hypointense, moderately T2 hyperintense lesion
seen within the posteromedial aspect of the right lobe on the prior
MRI. No abnormalities are seen on the current ultrasound that
correspond to the area of abnormality noted within the left lobe of
the liver on the prior MRI. Further correlation with follow-up MRI
of the abdomen is recommended.

*** End of Addendum ***
FINDINGS: Gallbladder:

No gallstones or wall thickening visualized (2.5 mm). No sonographic
Murphy sign noted by sonographer.

Common bile duct:

Diameter: 3.9 mm

Liver:

A 3.8 cm x 3.3 cm x 4.8 cm area of heterogeneous hypoechogenicity is
seen within the right lobe of the liver. A mild amount of flow is
seen within this region on color Doppler evaluation. Heterogeneously
increased echogenicity of the liver parenchyma is seen. Portal vein
is patent on color Doppler imaging with normal direction of blood
flow towards the liver.

Other: None.
IMPRESSION: 1. Hypoechoic lesion within the right lobe of the liver which may
represent an atypical hemangioma. MRI correlation is recommended.

## 2023-09-17 ENCOUNTER — Other Ambulatory Visit: Payer: Self-pay | Admitting: Family Medicine

## 2023-09-17 DIAGNOSIS — R7989 Other specified abnormal findings of blood chemistry: Secondary | ICD-10-CM
